# Patient Record
Sex: Female | Born: 1975 | Race: White | Hispanic: No | Smoking: Current every day smoker
Health system: Southern US, Community
[De-identification: ages and names within clinical notes are randomized; demographics above are authoritative.]

## PROBLEM LIST (undated history)

## (undated) DIAGNOSIS — I10 Essential (primary) hypertension: Secondary | ICD-10-CM

## (undated) HISTORY — PX: TONSILLECTOMY: SUR1361

## (undated) HISTORY — PX: TUBAL LIGATION: SHX77

---

## 2013-08-08 ENCOUNTER — Emergency Department (HOSPITAL_COMMUNITY): Payer: BC Managed Care – PPO

## 2013-08-08 ENCOUNTER — Emergency Department (HOSPITAL_COMMUNITY)
Admission: EM | Admit: 2013-08-08 | Discharge: 2013-08-08 | Disposition: A | Discharge status: Home / Self Care | Payer: BC Managed Care – PPO | Attending: Emergency Medicine | Admitting: Emergency Medicine

## 2013-08-08 ENCOUNTER — Encounter (HOSPITAL_COMMUNITY): Payer: Self-pay | Admitting: Emergency Medicine

## 2013-08-08 DIAGNOSIS — IMO0002 Reserved for concepts with insufficient information to code with codable children: Secondary | ICD-10-CM | POA: Insufficient documentation

## 2013-08-08 DIAGNOSIS — Y9389 Activity, other specified: Secondary | ICD-10-CM | POA: Insufficient documentation

## 2013-08-08 DIAGNOSIS — Z79899 Other long term (current) drug therapy: Secondary | ICD-10-CM | POA: Insufficient documentation

## 2013-08-08 DIAGNOSIS — F17200 Nicotine dependence, unspecified, uncomplicated: Secondary | ICD-10-CM | POA: Insufficient documentation

## 2013-08-08 DIAGNOSIS — S7002XA Contusion of left hip, initial encounter: Secondary | ICD-10-CM

## 2013-08-08 DIAGNOSIS — S139XXA Sprain of joints and ligaments of unspecified parts of neck, initial encounter: Secondary | ICD-10-CM | POA: Insufficient documentation

## 2013-08-08 DIAGNOSIS — S161XXA Strain of muscle, fascia and tendon at neck level, initial encounter: Secondary | ICD-10-CM

## 2013-08-08 DIAGNOSIS — Y92410 Unspecified street and highway as the place of occurrence of the external cause: Secondary | ICD-10-CM | POA: Insufficient documentation

## 2013-08-08 DIAGNOSIS — I10 Essential (primary) hypertension: Secondary | ICD-10-CM | POA: Insufficient documentation

## 2013-08-08 DIAGNOSIS — S301XXA Contusion of abdominal wall, initial encounter: Secondary | ICD-10-CM | POA: Insufficient documentation

## 2013-08-08 DIAGNOSIS — S7000XA Contusion of unspecified hip, initial encounter: Secondary | ICD-10-CM | POA: Insufficient documentation

## 2013-08-08 DIAGNOSIS — S20219A Contusion of unspecified front wall of thorax, initial encounter: Secondary | ICD-10-CM | POA: Insufficient documentation

## 2013-08-08 HISTORY — DX: Essential (primary) hypertension: I10

## 2013-08-08 MED ORDER — HYDROCODONE-ACETAMINOPHEN 5-325 MG PO TABS: 1.0000 | ORAL_TABLET | Freq: Four times a day (QID) | ORAL | Status: AC | PRN

## 2013-08-08 MED ORDER — TRAMADOL HCL 50 MG PO TABS
50.0000 mg | ORAL_TABLET | Freq: Four times a day (QID) | ORAL | Status: DC | PRN
Start: 2013-08-08 — End: 2013-08-08

## 2013-08-08 MED ORDER — NAPROXEN 500 MG PO TABS: 500.0000 mg | ORAL_TABLET | Freq: Two times a day (BID) | ORAL | Status: AC

## 2013-08-08 MED ORDER — TRAMADOL HCL 50 MG PO TABS
50.0000 mg | ORAL_TABLET | Freq: Once | ORAL | Status: DC
  Filled 2013-08-08: qty 1

## 2013-08-08 MED ORDER — METHOCARBAMOL 500 MG PO TABS
500.0000 mg | ORAL_TABLET | Freq: Once | ORAL | Status: AC
  Administered 2013-08-08: 500 mg via ORAL
  Filled 2013-08-08 (×2): qty 1

## 2013-08-08 MED ORDER — METHOCARBAMOL 500 MG PO TABS
500.0000 mg | ORAL_TABLET | Freq: Two times a day (BID) | ORAL | Status: AC
Start: 2013-08-08 — End: ?

## 2013-08-08 NOTE — ED Notes (Signed)
Patient returned from xray.
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
## 2013-08-09 NOTE — ED Provider Notes (Signed)
Medical screening examination/treatment/procedure(s) were performed by non-physician practitioner and as supervising physician I was immediately available for consultation/collaboration.   EKG Interpretation None       Toy Baker, MD 08/09/13 [phone]
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
